# Patient Record
Sex: Male | Born: 1995 | Race: Black or African American | Hispanic: No | Marital: Single | Smoking: Never smoker
Health system: Southern US, Community
[De-identification: ages and names within clinical notes are randomized; demographics above are authoritative.]

## PROBLEM LIST (undated history)

## (undated) DIAGNOSIS — K589 Irritable bowel syndrome without diarrhea: Secondary | ICD-10-CM

## (undated) HISTORY — DX: Irritable bowel syndrome, unspecified: K58.9

---
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
## 2022-08-19 ENCOUNTER — Emergency Department (HOSPITAL_BASED_OUTPATIENT_CLINIC_OR_DEPARTMENT_OTHER): Payer: 59

## 2022-08-19 ENCOUNTER — Emergency Department (HOSPITAL_BASED_OUTPATIENT_CLINIC_OR_DEPARTMENT_OTHER)
Admission: EM | Admit: 2022-08-19 | Discharge: 2022-08-20 | Disposition: A | Discharge status: Home / Self Care | Payer: 59 | Attending: Emergency Medicine | Admitting: Emergency Medicine

## 2022-08-19 ENCOUNTER — Other Ambulatory Visit: Payer: Self-pay

## 2022-08-19 ENCOUNTER — Encounter (HOSPITAL_BASED_OUTPATIENT_CLINIC_OR_DEPARTMENT_OTHER): Payer: Self-pay

## 2022-08-19 DIAGNOSIS — F39 Unspecified mood [affective] disorder: Secondary | ICD-10-CM | POA: Diagnosis not present

## 2022-08-19 DIAGNOSIS — Z1152 Encounter for screening for COVID-19: Secondary | ICD-10-CM | POA: Diagnosis not present

## 2022-08-19 DIAGNOSIS — R9431 Abnormal electrocardiogram [ECG] [EKG]: Secondary | ICD-10-CM | POA: Diagnosis not present

## 2022-08-19 DIAGNOSIS — S62632B Displaced fracture of distal phalanx of right middle finger, initial encounter for open fracture: Secondary | ICD-10-CM | POA: Insufficient documentation

## 2022-08-19 DIAGNOSIS — Z23 Encounter for immunization: Secondary | ICD-10-CM | POA: Insufficient documentation

## 2022-08-19 DIAGNOSIS — F1220 Cannabis dependence, uncomplicated: Secondary | ICD-10-CM | POA: Diagnosis not present

## 2022-08-19 DIAGNOSIS — S6991XA Unspecified injury of right wrist, hand and finger(s), initial encounter: Secondary | ICD-10-CM | POA: Diagnosis not present

## 2022-08-19 DIAGNOSIS — R45851 Suicidal ideations: Secondary | ICD-10-CM

## 2022-08-19 DIAGNOSIS — S62632A Displaced fracture of distal phalanx of right middle finger, initial encounter for closed fracture: Secondary | ICD-10-CM | POA: Diagnosis not present

## 2022-08-19 LAB — RESP PANEL BY RT-PCR (RSV, FLU A&B, COVID)  RVPGX2
Influenza A by PCR: NEGATIVE
Influenza B by PCR: NEGATIVE
Resp Syncytial Virus by PCR: NEGATIVE
SARS Coronavirus 2 by RT PCR: NEGATIVE

## 2022-08-19 LAB — CBC WITH DIFFERENTIAL/PLATELET
Abs Immature Granulocytes: 0.03 10*3/uL (ref 0.00–0.07)
Basophils Absolute: 0 10*3/uL (ref 0.0–0.1)
Basophils Relative: 0 %
Eosinophils Absolute: 0 10*3/uL (ref 0.0–0.5)
Eosinophils Relative: 0 %
HCT: 42.1 % (ref 39.0–52.0)
Hemoglobin: 14.4 g/dL (ref 13.0–17.0)
Immature Granulocytes: 0 %
Lymphocytes Relative: 7 %
Lymphs Abs: 0.9 10*3/uL (ref 0.7–4.0)
MCH: 27.5 pg (ref 26.0–34.0)
MCHC: 34.2 g/dL (ref 30.0–36.0)
MCV: 80.3 fL (ref 80.0–100.0)
Monocytes Absolute: 1.1 10*3/uL — ABNORMAL HIGH (ref 0.1–1.0)
Monocytes Relative: 8 %
Neutro Abs: 10.5 10*3/uL — ABNORMAL HIGH (ref 1.7–7.7)
Neutrophils Relative %: 85 %
Platelets: 301 10*3/uL (ref 150–400)
RBC: 5.24 MIL/uL (ref 4.22–5.81)
RDW: 12.9 % (ref 11.5–15.5)
WBC: 12.5 10*3/uL — ABNORMAL HIGH (ref 4.0–10.5)
nRBC: 0 % (ref 0.0–0.2)

## 2022-08-19 LAB — RAPID URINE DRUG SCREEN, HOSP PERFORMED
Amphetamines: NOT DETECTED
Barbiturates: NOT DETECTED
Benzodiazepines: POSITIVE — AB
Cocaine: NOT DETECTED
Opiates: NOT DETECTED
Tetrahydrocannabinol: POSITIVE — AB

## 2022-08-19 LAB — BASIC METABOLIC PANEL
Anion gap: 9 (ref 5–15)
BUN: 17 mg/dL (ref 6–20)
CO2: 24 mmol/L (ref 22–32)
Calcium: 9 mg/dL (ref 8.9–10.3)
Chloride: 101 mmol/L (ref 98–111)
Creatinine, Ser: 1.26 mg/dL — ABNORMAL HIGH (ref 0.61–1.24)
GFR, Estimated: >60 mL/min (ref >60)
Glucose, Bld: 88 mg/dL (ref 70–99)
Potassium: 3.6 mmol/L (ref 3.5–5.1)
Sodium: 134 mmol/L — ABNORMAL LOW (ref 135–145)

## 2022-08-19 LAB — ETHANOL: Alcohol, Ethyl (B): <10 mg/dL (ref <10)

## 2022-08-19 MED ORDER — TETANUS-DIPHTH-ACELL PERTUSSIS 5-2.5-18.5 LF-MCG/0.5 IM SUSY
0.5000 mL | PREFILLED_SYRINGE | Freq: Once | INTRAMUSCULAR | Status: AC
  Administered 2022-08-19: 0.5 mL via INTRAMUSCULAR
  Filled 2022-08-19: qty 0.5

## 2022-08-19 MED ORDER — ZIPRASIDONE MESYLATE 20 MG IM SOLR
20.0000 mg | INTRAMUSCULAR | Status: AC | PRN
  Administered 2022-08-19: 20 mg via INTRAMUSCULAR
  Filled 2022-08-19: qty 20

## 2022-08-19 MED ORDER — OLANZAPINE 5 MG PO TBDP
10.0000 mg | ORAL_TABLET | Freq: Once | ORAL | Status: DC
  Filled 2022-08-19: qty 2

## 2022-08-19 MED ORDER — BENZONATATE 100 MG PO CAPS: 200.0000 mg | ORAL_CAPSULE | Freq: Three times a day (TID) | ORAL | Status: DC | PRN

## 2022-08-19 MED ORDER — OLANZAPINE 5 MG PO TABS
5.0000 mg | ORAL_TABLET | Freq: Every day | ORAL | Status: DC
  Administered 2022-08-19: 5 mg via ORAL
  Filled 2022-08-19: qty 1

## 2022-08-19 MED ORDER — CEFAZOLIN SODIUM-DEXTROSE 1-4 GM/50ML-% IV SOLN
1.0000 g | Freq: Once | INTRAVENOUS | Status: AC
  Administered 2022-08-19: 1 g via INTRAVENOUS
  Filled 2022-08-19: qty 50

## 2022-08-19 MED ORDER — TRAZODONE HCL 50 MG PO TABS
50.0000 mg | ORAL_TABLET | Freq: Every day | ORAL | Status: DC
  Administered 2022-08-19: 50 mg via ORAL
  Filled 2022-08-19: qty 1

## 2022-08-19 MED ORDER — CEPHALEXIN 500 MG PO CAPS
500.0000 mg | ORAL_CAPSULE | Freq: Three times a day (TID) | ORAL | Status: DC
  Administered 2022-08-19 – 2022-08-20 (×3): 500 mg via ORAL
  Filled 2022-08-19 (×2): qty 1
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
## 2022-08-19 NOTE — ED Notes (Signed)
Report given to Chong Sicilian, Agricultural consultant at [employer]. [facility] on the way as patient was given Geodon. Police officer will ride with patient to [employer]

## 2022-08-19 NOTE — ED Provider Notes (Signed)
Pt accepted in transfer to [facility].  Hand recommends outpatient f/u, abx, and dressing changes.  He is currently asleep s/p Geodon injection.  Pt is medically clear for psych Elizabeth Sauer     Isla Pence, MD 08/19/22 1114
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
## 2022-08-19 NOTE — ED Notes (Signed)
Attempting to put dressing on pt's wound and irrigate wound, pt has pressured speech and flight of idea, pt having a hard time following directions, pt continues to wave bloody hand around.  PD at bedside, attempted de escalation, attempt unsuccessful, meds given.

## 2022-08-19 NOTE — ED Notes (Signed)
Report given to [facility] 

## 2022-08-19 NOTE — Discharge Instructions (Signed)
Keflex for 7 days total (start 3/12)   Discharge recommendations:  Patient is to take medications as prescribed. Please see information for follow-up appointment with psychiatry and therapy. Please follow up with your primary care provider for all medical related needs.   Therapy: We recommend that patient participate in individual therapy to address mental health concerns.  Medications: The patient or guardian is to contact a medical professional and/or outpatient provider to address any new side effects that develop. The patient or guardian should update outpatient providers of any new medications and/or medication changes.   Atypical antipsychotics: If you are prescribed an atypical antipsychotic, it is recommended that your height, weight, BMI, blood pressure, fasting lipid panel, and fasting blood sugar be monitored by your outpatient providers.  Safety:  The patient should abstain from use of illicit substances/drugs and abuse of any medications. If symptoms worsen or do not continue to improve or if the patient becomes actively suicidal or homicidal then it is recommended that the patient return to the closest hospital emergency department, the [facility], or call 911 for further evaluation and treatment. National Suicide Prevention Lifeline 1-800-SUICIDE or (765) 771-3146.  About 988 988 offers 24/7 access to trained crisis counselors who can help people experiencing mental health-related distress. People can call or text 988 or chat 988lifeline.org for themselves or if they are worried about a loved one who may need crisis support.  Crisis Mobile: [facility]:                     762 026 8685 (for crisis response 24 hours a day) [place]:                                            ([phone]) [phone]

## 2022-08-19 NOTE — ED Notes (Signed)
Pt in bed, soaking pt's finger in saline and betadine per md instructions, pt denies pain, pt cuffed to the bed, no signs of injury, repositioned pt's wrist for comfort, pt oriented times three.

## 2022-08-19 NOTE — ED Notes (Signed)
Irrigated wound with 500 ml of ns and spray, dressing placed per md instructions, xeroform, gauze and finger splint,  pt states that he doesn't want me to change the sheets on his bed, pt awaits transport to [facility].
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
## 2022-08-19 NOTE — ED Notes (Signed)
Pt arrived to [facility] via [facility], accompanied by [employer] officers X 2. Pt arrives with IVC paperwork. Pt alert to verbal stimuli. Reportedly given Geodon prior to transfer to this facility. Gauze and coban dressing C/D/I to R middle finger. Forensic restraint in place to LUE. Skin intact.

## 2022-08-19 NOTE — ED Notes (Signed)
Psych at bedside assessing pt at this time.

## 2022-08-19 NOTE — ED Notes (Signed)
Pt is refusing for [place] to provide care to his injured finger on the RT hand. Pt has laceration to 3rd digit. Bleeding slowly. It is covered now with kerlex and taped. Pt will not allow anything else to be done to the finger.
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
## 2022-08-19 NOTE — Progress Notes (Signed)
LCSW Progress Note  [id number]   KHYRAN ENGBERG  08/19/2022  2:42 PM  Description:   Inpatient Psychiatric Referral  Patient was recommended inpatient per Charmaine Downs, NP. There are no available beds at [facility]. Patient was referred to the following facilities:   Destination  Service Provider Address Phone Fax  [address] [place] [address] [phone]  [facility]  [address] [place] [address] [phone]  [place], [place] [id number] [phone]  [facility]  [place], [place] [address] [phone] [place]  [address], [place] [address] [phone]  [facility]  [place], [place] [address] [phone]  [facility]  [address] [place] [address] [phone] [place], [place] [address] [phone] [facility]  [id number] [place] [address] [phone] [facility]  [address] [place], [place] [address] [phone] [place] [address] [place]  [facility]  [address] [place] [address] [phone]  [facility]  [address] [place] [address] [phone]  [place] [place] [address] [phone]  [facility]  [place] [address] [phone]  [facility]  [address], [place] [address] [phone] [facility]  [address], [place] [address] [phone] [facility]  [address] [place] [address] [place]  [address], [place] [address] [phone]  [facility]  [address] [place] [address] [place]  [facility]  [address], [place] [address] [phone] [place]  [address] [place] [address] [id number]  [facility]  [address] [place] [address] [phone]    Situation ongoing, CSW to continue following and update chart as more information becomes available.      Denna Haggard, Latanya Presser  08/19/2022 2:42 PM
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
## 2022-08-20 DIAGNOSIS — F39 Unspecified mood [affective] disorder: Secondary | ICD-10-CM

## 2022-08-20 MED ORDER — OLANZAPINE 10 MG PO TBDP
10.0000 mg | ORAL_TABLET | Freq: Three times a day (TID) | ORAL | Status: DC | PRN
  Administered 2022-08-20: 10 mg via ORAL
  Filled 2022-08-20: qty 1

## 2022-08-20 MED ORDER — CEPHALEXIN 500 MG PO CAPS: 500.0000 mg | ORAL_CAPSULE | Freq: Three times a day (TID) | ORAL | 0 refills | Status: AC

## 2022-08-20 MED ORDER — LORAZEPAM 1 MG PO TABS: 1.0000 mg | ORAL_TABLET | ORAL | Status: DC | PRN

## 2022-08-20 MED ORDER — ZIPRASIDONE MESYLATE 20 MG IM SOLR: 20.0000 mg | INTRAMUSCULAR | Status: DC | PRN
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
## 2022-08-20 NOTE — ED Notes (Signed)
AVS with prescriptions provided to and discussed with patient. Pt verbalizes understanding of discharge instructions and denies any questions or concerns at this time. Pt ambulated out of department independently with steady gait. Pt left ED in [employer] custody.
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
